# Patient Record
Sex: Male | Born: 1945 | Race: White | Hispanic: No | Marital: Single | Smoking: Current every day smoker
Health system: Southern US, Community
[De-identification: ages and names within clinical notes are randomized; demographics above are authoritative.]

## PROBLEM LIST (undated history)

## (undated) DIAGNOSIS — I219 Acute myocardial infarction, unspecified: Secondary | ICD-10-CM

## (undated) DIAGNOSIS — C61 Malignant neoplasm of prostate: Secondary | ICD-10-CM

## (undated) DIAGNOSIS — I10 Essential (primary) hypertension: Secondary | ICD-10-CM

## (undated) HISTORY — PX: BACK SURGERY: SHX140

## (undated) HISTORY — PX: COLON RESECTION: SHX5231

## (undated) HISTORY — PX: APPENDECTOMY: SHX54

---

## 2004-09-20 ENCOUNTER — Emergency Department: Payer: Self-pay | Admitting: Emergency Medicine

## 2010-10-30 ENCOUNTER — Emergency Department: Payer: Self-pay | Admitting: Emergency Medicine

## 2010-12-13 ENCOUNTER — Emergency Department: Payer: Self-pay | Admitting: Unknown Physician Specialty

## 2016-12-19 ENCOUNTER — Emergency Department: Payer: Medicare Other

## 2016-12-19 ENCOUNTER — Inpatient Hospital Stay
Admission: EM | Admit: 2016-12-19 | Discharge: 2016-12-20 | DRG: 312 | Disposition: A | Discharge status: Home / Self Care | Payer: Medicare Other | Attending: Internal Medicine | Admitting: Internal Medicine

## 2016-12-19 DIAGNOSIS — Z8051 Family history of malignant neoplasm of kidney: Secondary | ICD-10-CM

## 2016-12-19 DIAGNOSIS — S0003XA Contusion of scalp, initial encounter: Secondary | ICD-10-CM

## 2016-12-19 DIAGNOSIS — C7951 Secondary malignant neoplasm of bone: Secondary | ICD-10-CM | POA: Diagnosis present

## 2016-12-19 DIAGNOSIS — R55 Syncope and collapse: Secondary | ICD-10-CM | POA: Diagnosis not present

## 2016-12-19 DIAGNOSIS — I10 Essential (primary) hypertension: Secondary | ICD-10-CM | POA: Diagnosis present

## 2016-12-19 DIAGNOSIS — Z8249 Family history of ischemic heart disease and other diseases of the circulatory system: Secondary | ICD-10-CM

## 2016-12-19 DIAGNOSIS — W1839XA Other fall on same level, initial encounter: Secondary | ICD-10-CM | POA: Diagnosis present

## 2016-12-19 DIAGNOSIS — N289 Disorder of kidney and ureter, unspecified: Secondary | ICD-10-CM

## 2016-12-19 DIAGNOSIS — I951 Orthostatic hypotension: Principal | ICD-10-CM | POA: Diagnosis present

## 2016-12-19 DIAGNOSIS — J101 Influenza due to other identified influenza virus with other respiratory manifestations: Secondary | ICD-10-CM

## 2016-12-19 DIAGNOSIS — Z79899 Other long term (current) drug therapy: Secondary | ICD-10-CM | POA: Diagnosis not present

## 2016-12-19 DIAGNOSIS — Z7982 Long term (current) use of aspirin: Secondary | ICD-10-CM

## 2016-12-19 DIAGNOSIS — C61 Malignant neoplasm of prostate: Secondary | ICD-10-CM | POA: Diagnosis present

## 2016-12-19 DIAGNOSIS — Y92511 Restaurant or cafe as the place of occurrence of the external cause: Secondary | ICD-10-CM | POA: Diagnosis not present

## 2016-12-19 DIAGNOSIS — N179 Acute kidney failure, unspecified: Secondary | ICD-10-CM | POA: Diagnosis present

## 2016-12-19 HISTORY — DX: Acute myocardial infarction, unspecified: I21.9

## 2016-12-19 HISTORY — DX: Essential (primary) hypertension: I10

## 2016-12-19 HISTORY — DX: Malignant neoplasm of prostate: C61

## 2016-12-19 LAB — CBC
HEMATOCRIT: 42.3 % (ref 40.0–52.0)
HEMOGLOBIN: 14.2 g/dL (ref 13.0–18.0)
MCH: 29.7 pg (ref 26.0–34.0)
MCHC: 33.5 g/dL (ref 32.0–36.0)
MCV: 88.6 fL (ref 80.0–100.0)
Platelets: 183 10*3/uL (ref 150–440)
RBC: 4.77 MIL/uL (ref 4.40–5.90)
RDW: 13.9 % (ref 11.5–14.5)
WBC: 6.8 10*3/uL (ref 3.8–10.6)

## 2016-12-19 LAB — BASIC METABOLIC PANEL
ANION GAP: 14 (ref 5–15)
BUN: 41 mg/dL — ABNORMAL HIGH (ref 6–20)
CO2: 26 mmol/L (ref 22–32)
Calcium: 9.4 mg/dL (ref 8.9–10.3)
Chloride: 96 mmol/L — ABNORMAL LOW (ref 101–111)
Creatinine, Ser: 2.05 mg/dL — ABNORMAL HIGH (ref 0.61–1.24)
GFR calc Af Amer: 36 mL/min — ABNORMAL LOW (ref >60)
GFR calc non Af Amer: 31 mL/min — ABNORMAL LOW (ref >60)
GLUCOSE: 130 mg/dL — AB (ref 65–99)
POTASSIUM: 3.7 mmol/L (ref 3.5–5.1)
Sodium: 136 mmol/L (ref 135–145)

## 2016-12-19 LAB — INFLUENZA PANEL BY PCR (TYPE A & B)
Influenza A By PCR: POSITIVE — AB
Influenza B By PCR: NEGATIVE

## 2016-12-19 MED ORDER — ONDANSETRON HCL 4 MG/2ML IJ SOLN
4.0000 mg | Freq: Once | INTRAMUSCULAR | Status: DC
  Filled 2016-12-19: qty 2

## 2016-12-19 MED ORDER — SODIUM CHLORIDE 0.9 % IV BOLUS (SEPSIS)
1000.0000 mL | Freq: Once | INTRAVENOUS | Status: AC
  Administered 2016-12-19: 1000 mL via INTRAVENOUS

## 2016-12-19 MED ORDER — OSELTAMIVIR PHOSPHATE 75 MG PO CAPS
75.0000 mg | ORAL_CAPSULE | Freq: Once | ORAL | Status: AC
  Administered 2016-12-19: 75 mg via ORAL
  Filled 2016-12-19: qty 1

## 2016-12-19 MED ORDER — SODIUM CHLORIDE 0.9 % IV BOLUS (SEPSIS)
1000.0000 mL | Freq: Once | INTRAVENOUS | Status: AC
Start: 2016-12-19 — End: 2016-12-19
  Administered 2016-12-19: 1000 mL via INTRAVENOUS

## 2016-12-19 MED ORDER — IBUPROFEN 600 MG PO TABS
600.0000 mg | ORAL_TABLET | Freq: Once | ORAL | Status: AC
  Administered 2016-12-19: 600 mg via ORAL
  Filled 2016-12-19: qty 1

## 2016-12-19 NOTE — ED Provider Notes (Signed)
[facility] Emergency Department Provider Note  ____________________________________________  Time seen: Approximately 6:16 PM  I have reviewed the triage vital signs and the nursing notes.   HISTORY  Chief Complaint Loss of Consciousness; Head Injury; and Fall    HPI Jeremy Suarez is a 71 y.o. 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 The patient reports that he has been feeling poorly with generalized weakness and a nonproductive mild cough for the past 4 days. His roommate currently has fever and flulike symptoms. He has been drinking plenty of fluid but has not been eating. He had one episode of vomiting after eating chicken noodle soup. Today, he went to a restaurant and was ordering food at the counter when he had a brief syncopal episode and hit his head. He did not have any preceding symptoms, including chest pain, shortness of breath, visual changes, palpitations or lightheadedness. No urinary incontinence. At this time, the patient reports some mild discomfort around a left-sided scalp hematoma but otherwise denies any symptoms.   No past medical history on file.  There are no active problems to display for this patient.   No past surgical history on file.    Allergies Patient has no known allergies.  No family history on file.  Social History Social History  Substance Use Topics  . Smoking status: Current Every Day Smoker  . Smokeless tobacco: Never Used  . Alcohol use Not on file    Review of Systems Constitutional: No fever/chills.No lightheadedness. Positive syncope. Eyes: No visual changes. No blurred or double vision. ENT: No sore throat. No congestion or rhinorrhea. No ear pain. Positive scalp contusion. Cardiovascular: Denies chest pain. Denies palpitations. Respiratory: Denies shortness of breath.  Positive mild cough. Gastrointestinal: No abdominal pain.  Positive  nausea, with one episode of vomiting.  No diarrhea.  No constipation. Genitourinary: Negative for dysuria. Musculoskeletal: Negative for back pain. No neck pain. Skin: Negative for rash. Neurological: Negative for headaches. No focal numbness, tingling or weakness. No difficulty walking.  10-point ROS otherwise negative.  ____________________________________________   PHYSICAL EXAM:  VITAL SIGNS: ED Triage Vitals  Enc Vitals Group     BP      Pulse      Resp      Temp      Temp src      SpO2      Weight      Height      Head Circumference      Peak Flow      Pain Score      Pain Loc      Pain Edu?      Excl. in [place]?     Constitutional: The patient is alert and oriented and answering questions appropriately. GCS is 15.  Eyes: Conjunctivae are normal.  EOMI. PERRLA. No scleral icterus. No eye discharge. No raccoon eyes. Head: 3 x 3" mild contusion over the left scalp without any skin break or ecchymosis.. No Battle sign. Nose: No congestion/rhinnorhea. No swelling over the nose or septal hematoma. EARS: TMs are without hemotympanum bilaterally. Mouth/Throat: Mucous membranes are dry. No dental injury or malocclusion. Neck: No stridor.  Supple.  No JVD. No meningismus. No midline C-spine tenderness to palpation, step-offs or deformities. Cardiovascular: Normal rate, regular rhythm. No murmurs, rubs or gallops.  Respiratory: Normal respiratory effort.  No accessory muscle use or retractions. Lungs CTAB.  No wheezes, rales or ronchi. Gastrointestinal: Soft, nontender and nondistended.  No guarding or rebound.  No peritoneal signs. Musculoskeletal: No LE edema. No ttp in the calves or palpable cords.  Negative Homan's sign. Pelvis is stable. No T or L-spine tenderness to palpation, step-offs or deformities. Neurologic:  A&Ox3.  Speech is clear.  Face and smile are symmetric.  EOMI.  Moves all extremities well. Skin:  Skin is warm, dry and intact. No rash noted. Psychiatric: Mood  and affect are normal. Speech and behavior are normal.  Normal judgement.  ____________________________________________   LABS (all labs ordered are listed, but only abnormal results are displayed)  Labs Reviewed  BASIC METABOLIC PANEL - Abnormal; Notable for the following:       Result Value   Chloride 96 (*)    Glucose, Bld 130 (*)    BUN 41 (*)    Creatinine, Ser 2.05 (*)    GFR calc non Af Amer 31 (*)    GFR calc Af Amer 36 (*)    All other components within normal limits  CBC  URINALYSIS, COMPLETE (UACMP) WITH MICROSCOPIC  TROPONIN I  INFLUENZA PANEL BY PCR (TYPE A & B)   ____________________________________________  EKG  ED ECG REPORT I, Eula Listen, the attending physician, personally viewed and interpreted this ECG.   Date: 12/19/2016  EKG Time: 1748  Rate: 88  Rhythm: normal sinus rhythm; poor baseline tracing  Axis: normal  Intervals:none  ST&T Change: No STEMI  ____________________________________________  RADIOLOGY  Dg Chest 2 View  Result Date: 12/19/2016 CLINICAL DATA:  Apnea, patient passed out.  Confusion. EXAM: CHEST  2 VIEW COMPARISON:  CXR report 09/20/2004 FINDINGS: The heart size and mediastinal contours are within normal limits. Emphysematous hyperinflation of the lungs without pulmonary consolidation, effusion or pneumothorax. No pulmonary edema. Aortic atherosclerosis without aneurysm. No suspicious nor acute osseous abnormality. IMPRESSION: Emphysematous hyperinflation of the lungs without acute pulmonary disease. Aortic atherosclerosis. Electronically Signed   By: Ashley Royalty M.D.   On: 12/19/2016 19:00   Ct Head Wo Contrast  Result Date: 12/19/2016 CLINICAL DATA:  Syncopal episode.  Fell and hit head. EXAM: CT HEAD WITHOUT CONTRAST TECHNIQUE: Contiguous axial images were obtained from the base of the skull through the vertex without intravenous contrast. COMPARISON:  None. FINDINGS: Brain: The ventricles are in the midline without  mass effect or shift. They are normal in size in configuration for age. No extra-axial fluid collections are identified. The gray-white differentiation is maintained. No findings for hemispheric infarction or intracranial hemorrhage. The brainstem and cerebellum appear normal. 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 Vascular: Scattered vascular calcifications. No hyperdense vessels or definite aneurysm. Skull: No skull fracture or bone lesion. Sinuses/Orbits: The paranasal sinuses and mastoid air cells are grossly clear. Mild scattered mucoperiosteal thickening involving the ethmoid sinuses and left maxillary sinus. The globes are intact. Other: Left-sided scalp hematoma but no radiopaque foreign body or underlying skull fracture. IMPRESSION: No acute intracranial findings or skull fracture. Electronically Signed   By: Marijo Sanes M.D.   On: 12/19/2016 18:14    ____________________________________________   PROCEDURES  Procedure(s) performed: None  Procedures  Critical Care performed: No ____________________________________________   INITIAL IMPRESSION / ASSESSMENT AND PLAN / ED COURSE  Pertinent labs & imaging results that were available during my care of the patient were reviewed by me and considered in my medical decision making (see chart for details).  71 y.o. male currently under treatment for metastatic prostate cancer presenting with syncope after 4 days of feeling poorly, poor by mouth  intake, and mild cough. Overall, the patient has reassuring vital signs and is afebrile. Will check him for influenza, pneumonia, and per my cardiac monitor to evaluate for arrhythmia although there is no evidence of this on his EKG. A troponin has been ordered. He'll receive his head CT to rule out intracranial injury from his fall with overlying scalp contusion. We will also get a chest x-ray, urinalysis, and orthostatic vital signs. Plan reevaluation for final  disposition.  ----------------------------------------- 7:19 PM on 12/19/2016 -----------------------------------------  The patient is markedly orthostatic on examination with a blood pressure which goes into the 80s just was sitting. Intravenous fluids have been ordered. Plan admission.  ____________________________________________  FINAL CLINICAL IMPRESSION(S) / ED DIAGNOSES  Final diagnoses:  Syncope, unspecified syncope type  Renal insufficiency  Contusion of scalp, initial encounter  Orthostasis         NEW MEDICATIONS STARTED DURING THIS VISIT:  New Prescriptions   No medications on file      Eula Listen, MD 12/19/16 1919

## 2016-12-19 NOTE — ED Triage Notes (Signed)
He arrives today via [facility] from a [place] take out where he drove himself and walked in then he had "passed out"   EMS reports that he was apneic with no pulse upon arrival of the fire dept but when the paramedic arrived he had ROSC  alert but confused upon arrival  He is rubbing the top of his head  EMS reports that bystanders stated he hit his head

## 2016-12-19 NOTE — ED Notes (Signed)
Gave pt food tray and ginger ale

## 2016-12-19 NOTE — H&P (Signed)
History and Physical   [facility] - [place] @ [facility] Admission History and Physical [employer], D.O.    Patient Name: Jeremy Suarez MR#: [id number] Date of Birth: 29-Nov-1945 Date of Admission: 12/19/2016  Referring MD/NP/PA: Dr. Mariea Clonts Primary Care Physician: No PCP Per Patient Outpatient Specialists: [place]  Patient coming from: Home  Chief Complaint: Syncope  HPI: Jeremy Suarez is a 71 y.o. male with a known history of prostate cancer with mets to bone (on hormonal therapy, declines chemo/radiation) was in a usual state of health until about 4 days ago when he describes the sudden onset of flulike symptoms including body aches, fevers/chills, weakness, dizziness, nonproductive cough. Patient states that he was at his brother's funeral when he interacted with several sits sick contacts. Today he was at a restaurant when he had an episode of sudden onset of dizziness and subsequent loss of consciousness with head trauma. He denied any symptoms such as chest pain, shortness of breath, nausea, palpitations. Symptoms resolved spontaneously   Otherwise there has been no change in status. Patient has been taking medication as prescribed and there has been no recent change in medication or diet.  No recent antibiotics.  There has been no recent, hospitalizations, travel. Positive sick contact at brother's funeral.  ED Course: Patient rec'd zofran, Tamiflu, NS x 3L  Review of Systems:  CONSTITUTIONAL: Positive fever/chills, fatigue, weakness, negative weight gain/loss, headache. EYES: No blurry or double vision. ENT: No tinnitus, postnasal drip, redness or soreness of the oropharynx. RESPIRATORY: Positive cough, negative dyspnea, wheeze.  No hemoptysis.  CARDIOVASCULAR: No chest pain, palpitations, syncope, orthopnea. No lower extremity edema.  GASTROINTESTINAL: No nausea, vomiting, abdominal pain, diarrhea, constipation.  No hematemesis, melena or hematochezia. GENITOURINARY: No  dysuria, frequency, hematuria. ENDOCRINE: No polyuria or nocturia. No heat or cold intolerance. HEMATOLOGY: No anemia, bruising, bleeding. INTEGUMENTARY: No rashes, ulcers, lesions. MUSCULOSKELETAL: No arthritis, gout, dyspnea. NEUROLOGIC: No numbness, tingling, ataxia, seizure-type activity, positive loss of consciousness and weakness. PSYCHIATRIC: No anxiety, depression, insomnia.   Medical history significant for hypertension, MI, prostate cancer  Surgical history significant for colon resection for polyps, back surgery and appendectomy   reports that he has been smoking.  He has never used smokeless tobacco. His alcohol and drug histories are not on file.  No Known Allergies  Family history significant for mom died of consultations related to DVT, father died of kidney cancer and brother died of heart attack. Family history has been reviewed and confirmed with patient.   Prior to Admission medications   Medication Sig Start Date End Date Taking? Authorizing Provider  aspirin EC 81 MG tablet Take 81 mg by mouth daily.   Yes Historical Provider, MD  METOPROLOL TARTRATE PO Take by mouth.   Yes Historical Provider, MD    Physical Exam: Vitals:   12/19/16 1827  BP: 102/74  Pulse: 82  Resp: 18  Temp: 98 F (36.7 C)  TempSrc: Oral  SpO2: 99%  Weight: 74.8 kg (165 lb)  Height: 5\' 10"  (1.778 m)    GENERAL: 71 y.o.-year-old male patient, well-developed, well-nourished lying in the bed in no acute distress.  Pleasant and cooperative.   HEENT: Head normocephalic. Left scalp intrusion. Pupils equal, round, reactive to light and accommodation. No scleral icterus. Extraocular muscles intact. Nares are patent. Oropharynx is clear. Mucus membranes dry NECK: Supple, full range of motion. No JVD, no bruit heard. No thyroid enlargement, no tenderness, no cervical lymphadenopathy. CHEST: Normal breath sounds bilaterally. No wheezing, rales, rhonchi or crackles.  No use of accessory muscles  of respiration.  No reproducible chest wall tenderness.  CARDIOVASCULAR: S1, S2 normal. No murmurs, rubs, or gallops. Cap refill <2 seconds. Pulses intact distally.  ABDOMEN: Soft, nondistended, nontender. No rebound, guarding, rigidity. Normoactive bowel sounds present in all four quadrants. No organomegaly or mass. EXTREMITIES: No pedal edema, cyanosis, or clubbing. No calf tenderness or Homan's sign.  NEUROLOGIC: The patient is alert and oriented x 3. Cranial nerves II through XII are grossly intact with no focal sensorimotor deficit. Muscle strength 5/5 in all extremities. Sensation intact. Gait not checked. PSYCHIATRIC:  Normal affect, mood, thought content. SKIN: Warm, dry, and intact without obvious rash, lesion, or ulcer.    Labs on Admission:  CBC:  Recent Labs Lab 12/19/16 1751  WBC 6.8  HGB 14.2  HCT 42.3  MCV 88.6  PLT [id number]   Basic Metabolic Panel:  Recent Labs Lab 12/19/16 1751  NA 136  K 3.7  CL 96*  CO2 26  GLUCOSE 130*  BUN 41*  CREATININE 2.05*  CALCIUM 9.4   GFR: Estimated Creatinine Clearance: 35.1 mL/min (by C-G formula based on SCr of 2.05 mg/dL (H)). Liver Function Tests: No results for input(s): AST, ALT, ALKPHOS, BILITOT, PROT, ALBUMIN in the last 168 hours. No results for input(s): LIPASE, AMYLASE in the last 168 hours. No results for input(s): AMMONIA in the last 168 hours. Coagulation Profile: No results for input(s): INR, PROTIME in the last 168 hours. Cardiac Enzymes: No results for input(s): CKTOTAL, CKMB, CKMBINDEX, TROPONINI in the last 168 hours. BNP (last 3 results) No results for input(s): PROBNP in the last 8760 hours. HbA1C: No results for input(s): HGBA1C in the last 72 hours. CBG: No results for input(s): GLUCAP in the last 168 hours. Lipid Profile: No results for input(s): CHOL, HDL, LDLCALC, TRIG, CHOLHDL, LDLDIRECT in the last 72 hours. Thyroid Function Tests: No results for input(s): TSH, T4TOTAL, FREET4, T3FREE,  THYROIDAB in the last 72 hours. Anemia Panel: No results for input(s): VITAMINB12, FOLATE, FERRITIN, TIBC, IRON, RETICCTPCT in the last 72 hours. Urine analysis: No results found for: COLORURINE, APPEARANCEUR, LABSPEC, PHURINE, GLUCOSEU, HGBUR, BILIRUBINUR, KETONESUR, PROTEINUR, UROBILINOGEN, NITRITE, LEUKOCYTESUR Sepsis Labs: @LABRCNTIP (procalcitonin:4,lacticidven:4) )No results found for this or any previous visit (from the past 240 hour(s)).   Radiological Exams on Admission: Dg Chest 2 View  Result Date: 12/19/2016 CLINICAL DATA:  Apnea, patient passed out.  Confusion. EXAM: CHEST  2 VIEW COMPARISON:  CXR report 09/20/2004 FINDINGS: The heart size and mediastinal contours are within normal limits. Emphysematous hyperinflation of the lungs without pulmonary consolidation, effusion or pneumothorax. No pulmonary edema. Aortic atherosclerosis without aneurysm. No suspicious nor acute osseous abnormality. IMPRESSION: Emphysematous hyperinflation of the lungs without acute pulmonary disease. Aortic atherosclerosis. Electronically Signed   By: Ashley Royalty M.D.   On: 12/19/2016 19:00   Ct Head Wo Contrast  Result Date: 12/19/2016 CLINICAL DATA:  Syncopal episode.  Fell and hit head. EXAM: CT HEAD WITHOUT CONTRAST TECHNIQUE: Contiguous axial images were obtained from the base of the skull through the vertex without intravenous contrast. COMPARISON:  None. FINDINGS: Brain: The ventricles are in the midline without mass effect or shift. They are normal in size in configuration for age. No extra-axial fluid collections are identified. The gray-white differentiation is maintained. No findings for hemispheric infarction or intracranial hemorrhage. The brainstem and cerebellum appear normal. 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 Vascular: Scattered vascular calcifications. No hyperdense vessels or definite aneurysm. Skull: No skull fracture or bone  lesion. Sinuses/Orbits: The paranasal sinuses and  mastoid air cells are grossly clear. Mild scattered mucoperiosteal thickening involving the ethmoid sinuses and left maxillary sinus. The globes are intact. Other: Left-sided scalp hematoma but no radiopaque foreign body or underlying skull fracture. IMPRESSION: No acute intracranial findings or skull fracture. Electronically Signed   By: Marijo Sanes M.D.   On: 12/19/2016 18:14    EKG: Normal sinus rhythm at 88 bpm with normal axis and nonspecific ST-T wave changes.   Assessment/Plan Active Problems:   * No active hospital problems. *    This is a 71 y.o. male with a history of hypertension, MI prostate cancer with bone mets now being admitted with:  1. Syncope likely secondary to orthostatic hypotension - Admit observation with telemetry monitoring - IV fluid hydration - Trend trops, check TSH, lipids - Consider cardio consult - Obtain records from [place] and consider echo, carotids (patient thinks he may have had these done recently)  2. Acute kidney injury  - IV fluids and repeat BMP in AM.  - Avoid nephrotoxic medications - hold lisinopril for now - Bladder scan and place foley catheter if evidence of urinary retention  3. Influenza B - Start Tamiflu  Admission status: Inpatient, telemetry  IV Fluids: IVNS Diet/Nutrition: Heart healthy Consults called: PT  DVT Px: Lovenox, SCDs and early ambulation. Code Status: Full Code  Disposition Plan: To home in 1-2 days   All the records are reviewed and case discussed with ED provider. Management plans discussed with the patient and/or family who express understanding and agree with plan of care.  Thelia Tanksley D.O. on 12/19/2016 at 10:43 PM Between 7am to 6pm - Pager - [phone] After 6pm go to [email or web address] - [employer] [place] [facility] Office [phone] CC: Primary care physician; No PCP Per Patient   12/19/2016, 10:43 PM

## 2016-12-20 ENCOUNTER — Inpatient Hospital Stay: Payer: Medicare Other

## 2016-12-20 ENCOUNTER — Inpatient Hospital Stay (HOSPITAL_COMMUNITY)
Admit: 2016-12-20 | Discharge: 2016-12-20 | Disposition: A | Discharge status: Home / Self Care | Payer: Medicare Other | Attending: Family Medicine | Admitting: Family Medicine

## 2016-12-20 ENCOUNTER — Encounter: Payer: Self-pay | Admitting: Family Medicine

## 2016-12-20 DIAGNOSIS — R55 Syncope and collapse: Secondary | ICD-10-CM

## 2016-12-20 LAB — URINE DRUG SCREEN, QUALITATIVE (ARMC ONLY)
AMPHETAMINES, UR SCREEN: NOT DETECTED
Barbiturates, Ur Screen: NOT DETECTED
Benzodiazepine, Ur Scrn: NOT DETECTED
Cannabinoid 50 Ng, Ur ~~LOC~~: POSITIVE — AB
Cocaine Metabolite,Ur ~~LOC~~: NOT DETECTED
MDMA (ECSTASY) UR SCREEN: NOT DETECTED
METHADONE SCREEN, URINE: NOT DETECTED
OPIATE, UR SCREEN: NOT DETECTED
Phencyclidine (PCP) Ur S: NOT DETECTED
Tricyclic, Ur Screen: NOT DETECTED

## 2016-12-20 LAB — URINALYSIS, DIPSTICK ONLY
Bilirubin Urine: NEGATIVE
GLUCOSE, UA: NEGATIVE mg/dL
Ketones, ur: 5 mg/dL — AB
LEUKOCYTES UA: NEGATIVE
Nitrite: NEGATIVE
PH: 5 (ref 5.0–8.0)
Protein, ur: NEGATIVE mg/dL
SPECIFIC GRAVITY, URINE: 1.012 (ref 1.005–1.030)

## 2016-12-20 LAB — URINALYSIS, COMPLETE (UACMP) WITH MICROSCOPIC
BILIRUBIN URINE: NEGATIVE
GLUCOSE, UA: NEGATIVE mg/dL
KETONES UR: 5 mg/dL — AB
LEUKOCYTES UA: NEGATIVE
Nitrite: NEGATIVE
PH: 5 (ref 5.0–8.0)
Protein, ur: NEGATIVE mg/dL
SPECIFIC GRAVITY, URINE: 1.012 (ref 1.005–1.030)

## 2016-12-20 LAB — TROPONIN I
Troponin I: <0.03 ng/mL (ref <0.03)
Troponin I: <0.03 ng/mL (ref <0.03)
Troponin I: <0.03 ng/mL (ref <0.03)

## 2016-12-20 LAB — ECHOCARDIOGRAM COMPLETE
HEIGHTINCHES: 70 in
Weight: 2640 oz

## 2016-12-20 LAB — LIPID PANEL
CHOL/HDL RATIO: 9 ratio
CHOLESTEROL: 217 mg/dL — AB (ref 0–200)
HDL: 24 mg/dL — ABNORMAL LOW (ref >40)
LDL Cholesterol: 154 mg/dL — ABNORMAL HIGH (ref 0–99)
Triglycerides: 197 mg/dL — ABNORMAL HIGH (ref <150)
VLDL: 39 mg/dL (ref 0–40)

## 2016-12-20 LAB — TSH: TSH: 1.293 u[IU]/mL (ref 0.350–4.500)

## 2016-12-20 MED ORDER — ENOXAPARIN SODIUM 30 MG/0.3ML ~~LOC~~ SOLN
30.0000 mg | SUBCUTANEOUS | Status: DC
  Administered 2016-12-20: 30 mg via SUBCUTANEOUS
  Filled 2016-12-20: qty 0.3

## 2016-12-20 MED ORDER — DM-GUAIFENESIN ER 30-600 MG PO TB12: 1.0000 | ORAL_TABLET | Freq: Two times a day (BID) | ORAL | Status: DC

## 2016-12-20 MED ORDER — ACETAMINOPHEN 650 MG RE SUPP: 650.0000 mg | RECTAL | Status: DC | PRN

## 2016-12-20 MED ORDER — ASPIRIN EC 81 MG PO TBEC
81.0000 mg | DELAYED_RELEASE_TABLET | Freq: Every day | ORAL | Status: DC
  Administered 2016-12-20: 81 mg via ORAL
  Filled 2016-12-20: qty 1

## 2016-12-20 MED ORDER — OSELTAMIVIR PHOSPHATE 30 MG PO CAPS: 30.0000 mg | ORAL_CAPSULE | Freq: Two times a day (BID) | ORAL | 0 refills | Status: AC

## 2016-12-20 MED ORDER — DEXTROMETHORPHAN POLISTIREX ER 30 MG/5ML PO SUER
30.0000 mg | Freq: Two times a day (BID) | ORAL | Status: DC
  Administered 2016-12-20 (×2): 30 mg via ORAL
  Filled 2016-12-20 (×3): qty 5

## 2016-12-20 MED ORDER — ACETAMINOPHEN 160 MG/5ML PO SOLN
650.0000 mg | ORAL | Status: DC | PRN
  Filled 2016-12-20: qty 20.3

## 2016-12-20 MED ORDER — ENOXAPARIN SODIUM 40 MG/0.4ML ~~LOC~~ SOLN: 40.0000 mg | SUBCUTANEOUS | Status: DC

## 2016-12-20 MED ORDER — SENNOSIDES-DOCUSATE SODIUM 8.6-50 MG PO TABS
1.0000 | ORAL_TABLET | Freq: Every evening | ORAL | Status: DC | PRN
  Administered 2016-12-20: 1 via ORAL
  Filled 2016-12-20: qty 1

## 2016-12-20 MED ORDER — NICOTINE 21 MG/24HR TD PT24
21.0000 mg | MEDICATED_PATCH | Freq: Every day | TRANSDERMAL | Status: DC
  Administered 2016-12-20: 21 mg via TRANSDERMAL
  Filled 2016-12-20: qty 1

## 2016-12-20 MED ORDER — GUAIFENESIN ER 600 MG PO TB12
600.0000 mg | ORAL_TABLET | Freq: Two times a day (BID) | ORAL | Status: DC
  Administered 2016-12-20 (×2): 600 mg via ORAL
  Filled 2016-12-20 (×2): qty 1

## 2016-12-20 MED ORDER — ACETAMINOPHEN 325 MG PO TABS
650.0000 mg | ORAL_TABLET | ORAL | Status: DC | PRN
  Administered 2016-12-20: 650 mg via ORAL
  Filled 2016-12-20: qty 2

## 2016-12-20 MED ORDER — METOPROLOL TARTRATE 25 MG PO TABS
12.5000 mg | ORAL_TABLET | Freq: Two times a day (BID) | ORAL | Status: DC
  Administered 2016-12-20 (×2): 12.5 mg via ORAL
  Filled 2016-12-20 (×2): qty 1

## 2016-12-20 MED ORDER — OSELTAMIVIR PHOSPHATE 30 MG PO CAPS
30.0000 mg | ORAL_CAPSULE | Freq: Two times a day (BID) | ORAL | Status: DC
  Administered 2016-12-20: 30 mg via ORAL
  Filled 2016-12-20 (×3): qty 1

## 2016-12-20 NOTE — Progress Notes (Signed)
Anticoagulation monitoring(Lovenox):  71yo  M ordered Lovenox 30 mg Q24h  Filed Weights   12/19/16 1827  Weight: 165 lb (74.8 kg)   BMI 23.7   Lab Results  Component Value Date   CREATININE 2.05 (H) 12/19/2016   Estimated Creatinine Clearance: 35.1 mL/min (by C-G formula based on SCr of 2.05 mg/dL (H)). Hemoglobin & Hematocrit     Component Value Date/Time   HGB 14.2 12/19/2016 1751   HCT 42.3 12/19/2016 1751     Per Protocol for Patient with estCrcl > 30 ml/min and BMI < 40, will transition to Lovenox 40 mg Q24h      Chinita Greenland PharmD Clinical Pharmacist 12/20/2016

## 2016-12-20 NOTE — Progress Notes (Signed)
Pharmacist - Prescriber Communication  Tamiflu has been modified to 30 mg po BID due to creatinine clearance 30 to 60 mL/min.  Jaidalyn Schillo A. [place], [place].D., BCPS Clinical Pharmacist 12/20/2016 [phone]

## 2016-12-20 NOTE — Discharge Instructions (Signed)
[facility] - [place] at [facility] ° °DIET:  °Cardiac diet ° °DISCHARGE CONDITION:  °Stable ° °ACTIVITY:  °Activity as tolerated ° °OXYGEN:  °Home Oxygen: No. °  °Oxygen Delivery: room air ° °DISCHARGE LOCATION:  °home  ° ° °ADDITIONAL DISCHARGE INSTRUCTION: ° ° °If you experience worsening of your admission symptoms, develop shortness of breath, life threatening emergency, suicidal or homicidal thoughts you must seek medical attention immediately by calling 911 or calling your MD immediately  if symptoms less severe. ° °You Must read complete instructions/literature along with all the possible adverse reactions/side effects for all the Medicines you take and that have been prescribed to you. Take any new Medicines after you have completely understood and accpet all the possible adverse reactions/side effects.  ° °Please note ° °You were cared for by a hospitalist during your hospital stay. If you have any questions about your discharge medications or the care you received while you were in the hospital after you are discharged, you can call the unit and asked to speak with the hospitalist on call if the hospitalist that took care of you is not available. Once you are discharged, your primary care physician will handle any further medical issues. Please note that NO REFILLS for any discharge medications will be authorized once you are discharged, as it is imperative that you return to your primary care physician (or establish a relationship with a primary care physician if you do not have one) for your aftercare needs so that they can reassess your need for medications and monitor your lab values. ° ° °

## 2016-12-20 NOTE — Discharge Summary (Signed)
[place] at [facility], 71 y.o., DOB 11-23-1945, MRN [id number]. 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 Patient in the emergency room was noted to have flu positive as well as orthostatic hypotension. Patient was admitted for further evaluation and therapy. He is doing much better. He is continued on Tamiflu. She will be ambulated recheck her orthostatics if he is doing better he'll be discharged home.            Consults  None  Significant Tests:  See full reports for all details     Dg Chest 2 View  Result Date: 12/19/2016 CLINICAL DATA:  Apnea, patient passed out.  Confusion. EXAM: CHEST  2 VIEW COMPARISON:  CXR report 09/20/2004 FINDINGS: The heart size and mediastinal contours are within normal limits. Emphysematous hyperinflation of the lungs without pulmonary consolidation, effusion or pneumothorax. No pulmonary edema. Aortic atherosclerosis without aneurysm. No suspicious nor acute osseous abnormality. IMPRESSION: Emphysematous hyperinflation of the lungs without acute pulmonary disease. Aortic atherosclerosis. Electronically Signed   By: Ashley Royalty M.D.   On: 12/19/2016 19:00   Ct Head Wo Contrast  Result Date: 12/19/2016 CLINICAL DATA:  Syncopal episode.  Fell and hit head. EXAM: CT HEAD  WITHOUT CONTRAST TECHNIQUE: Contiguous axial images were obtained from the base of the skull through the vertex without intravenous contrast. COMPARISON:  None. FINDINGS: Brain: The ventricles are in the midline without mass effect or shift. They are normal in size in configuration for age. No extra-axial fluid collections are identified. The gray-white differentiation is maintained. No findings for hemispheric infarction or intracranial hemorrhage. The brainstem and cerebellum appear normal. 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 Vascular: Scattered vascular calcifications. No hyperdense vessels or definite aneurysm. Skull: No skull fracture or bone lesion. Sinuses/Orbits: The paranasal sinuses and mastoid air cells are grossly clear. Mild scattered mucoperiosteal thickening involving the ethmoid sinuses and left maxillary sinus. The globes are intact. Other: Left-sided scalp hematoma but no radiopaque foreign body or underlying skull fracture. IMPRESSION: No acute intracranial findings or skull fracture. Electronically Signed   By: Marijo Sanes M.D.   On: 12/19/2016 18:14   US Carotid Bilateral (at [facility])  Result Date: 12/20/2016 CLINICAL DATA:  Syncope. EXAM: BILATERAL CAROTID DUPLEX ULTRASOUND TECHNIQUE: Pearline Cables scale imaging, color Doppler and duplex ultrasound were performed of bilateral carotid and vertebral arteries in the neck. COMPARISON:  CT 12/19/2016 . FINDINGS: Criteria: Quantification of carotid stenosis is based on velocity parameters that correlate the residual internal carotid diameter with NASCET-based stenosis levels, using the diameter of the distal internal carotid lumen as the denominator for stenosis measurement. The following velocity measurements were obtained: RIGHT ICA:  76/26 cm/sec CCA:  [id number] cm/sec SYSTOLIC ICA/CCA RATIO:  1.0 DIASTOLIC ICA/CCA RATIO:  1.1 ECA:  114 cm/sec LEFT ICA:  94/34 cm/sec CCA:  [id number] cm/sec SYSTOLIC ICA/CCA RATIO:  1.3 DIASTOLIC ICA/CCA  RATIO:  1.2 ECA:  112 cm/sec RIGHT CAROTID ARTERY: Mild right carotid bifurcation and proximal ICA atherosclerotic vascular disease. No flow limiting stenosis. RIGHT VERTEBRAL ARTERY:  Patent with antegrade flow. LEFT CAROTID ARTERY: Mild left carotid bifurcation and proximal ICA atherosclerotic vascular disease . No flow limiting stenosis. LEFT VERTEBRAL ARTERY:  Patent with antegrade flow. IMPRESSION: 1. 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 No flow limiting stenosis. Degree of stenosis less than 50%. 2. Mild left carotid bifurcation and proximal ICA atherosclerotic vascular disease. No flow limiting stenosis. Degree of stenosis less than 50%. 3. Vertebral arteries are patent antegrade flow. Electronically Signed   By: Marcello Moores  Register   On: 12/20/2016 12:13       Today   Subjective:   Berneda Rose  feeling better denies any chest pain or shortness of breath  Objective:   Blood pressure (!) 177/97, pulse 73, temperature 97.9 F (36.6 C), temperature source Oral, resp. rate 17, height 5\' 10"  (1.778 m), weight 165 lb (74.8 kg), SpO2 100 %.  .  Intake/Output Summary (Last 24 hours) at 12/20/16 1225 Last data filed at 12/20/16 1000  Gross per 24 hour  Intake             3000 ml  Output             1175 ml  Net             1825 ml    Exam VITAL SIGNS: Blood pressure (!) 177/97, pulse 73, temperature 97.9 F (36.6 C), temperature source Oral, resp. rate 17, height 5\' 10"  (1.778 m), weight 165 lb (74.8 kg), SpO2 100 %.  GENERAL:  71 y.o.-year-old patient lying in the bed with no acute distress.  EYES: Pupils equal, round, reactive to light and accommodation. No scleral icterus. Extraocular muscles intact.  HEENT: Head atraumatic, normocephalic. Oropharynx and nasopharynx clear.  NECK:  Supple, no jugular venous distention. No thyroid enlargement, no tenderness.  LUNGS: Normal breath sounds bilaterally, no wheezing, rales,rhonchi or crepitation. No use of  accessory muscles of respiration.  CARDIOVASCULAR: S1, S2 normal. No murmurs, rubs, or gallops.  ABDOMEN: Soft, nontender, nondistended. Bowel sounds present. No organomegaly or mass.  EXTREMITIES: No pedal edema, cyanosis, or clubbing.  NEUROLOGIC: Cranial nerves II through XII are intact. Muscle strength 5/5 in all extremities. Sensation intact. Gait not checked.  PSYCHIATRIC: The patient is alert and oriented x 3.  SKIN: No obvious rash, lesion, or ulcer.   Data Review     CBC w Diff: Lab Results  Component Value Date   WBC 6.8 12/19/2016   HGB 14.2 12/19/2016   HCT 42.3 12/19/2016   PLT 183 12/19/2016   CMP: Lab Results  Component Value Date   NA 136 12/19/2016   K 3.7 12/19/2016   CL 96 (L) 12/19/2016   CO2 26 12/19/2016   BUN 41 (H) 12/19/2016   CREATININE 2.05 (H) 12/19/2016  .  Micro Results No results found for this or any previous visit (from the past 240 hour(s)).      Code Status Orders        Start     Ordered   12/20/16 0041  Full code  Continuous     12/20/16 0040    Code Status History    Date Active Date Inactive Code Status Order ID Comments User Context   This patient has a current code status but no historical code status.  Follow-up Information    pcp In 7 days.   Why:  PLEASE CALL YOU PRIMARY CARE DOCTOR TO [facility] FOLLOW UP APPOINTMENT AS SOON AS POSSIBLE          Discharge Medications   Allergies as of 12/20/2016   No Known Allergies     Medication List    TAKE these medications   aspirin EC 81 MG tablet Take 81 mg by mouth daily.   METOPROLOL TARTRATE PO Take by mouth.   oseltamivir 30 MG capsule Commonly known as:  TAMIFLU Take 1 capsule (30 mg total) by mouth 2 (two) times daily.          Total Time in preparing paper work, data evaluation and todays exam - 35 minutes  Dustin Flock M.D on 12/20/2016 at 12:25 PM  [facility]   Office  [phone]

## 2016-12-20 NOTE — Progress Notes (Signed)
Patient was about to be discharged when Chaplain delivered an Advance Directive. Patient said that he would take the document home with him, read it and comeback if he decides to complete it.

## 2016-12-20 NOTE — Progress Notes (Signed)
*  PRELIMINARY RESULTS* Echocardiogram 2D Echocardiogram has been performed.  Sherrie Sport 12/20/2016, 12:52 PM

## 2016-12-21 LAB — HEMOGLOBIN A1C
HEMOGLOBIN A1C: 5.7 % — AB (ref 4.8–5.6)
MEAN PLASMA GLUCOSE: 117 mg/dL

## 2018-09-27 IMAGING — CT CT HEAD W/O CM
3 series · 14 of 47 positions shown, 16 images · non-contrast
Comparison: None.

CLINICAL DATA: Syncopal episode.  Fell and hit head.

EXAM:
CT HEAD WITHOUT CONTRAST
TECHNIQUE: Contiguous axial images were obtained from the base of the skull
through the vertex without intravenous contrast.

[Series 3: head wo · axial · 0.47mm/px · z∈[-34,+101]mm · 8 of 33 slices shown, 10 images]
[im 3/33  brain]
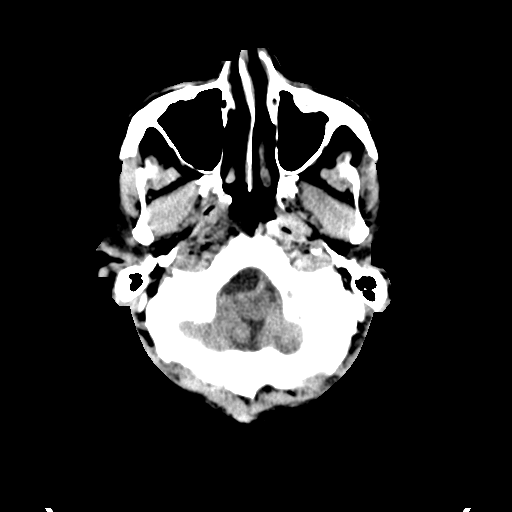
[im 3/33  bone]
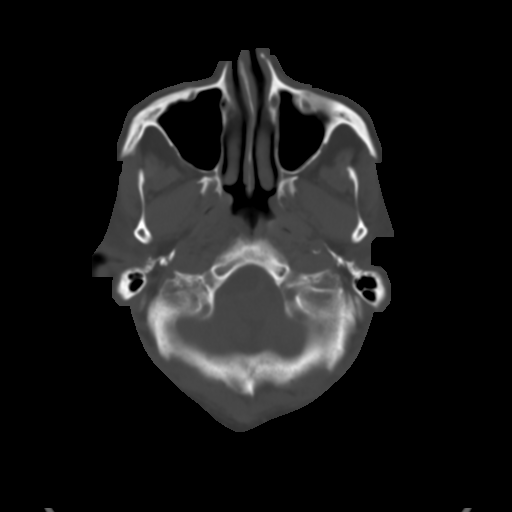
[im 7/33  brain]
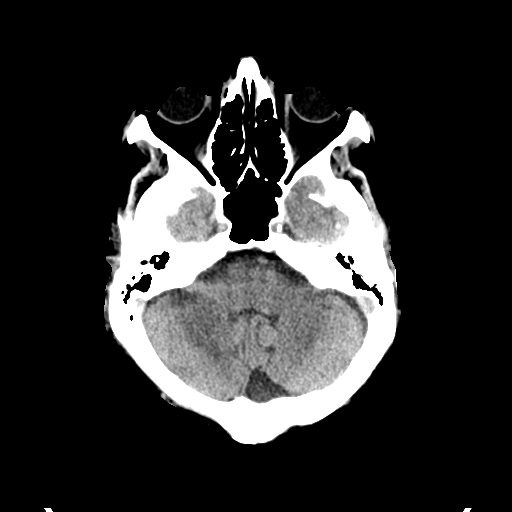
[im 10/33  brain]
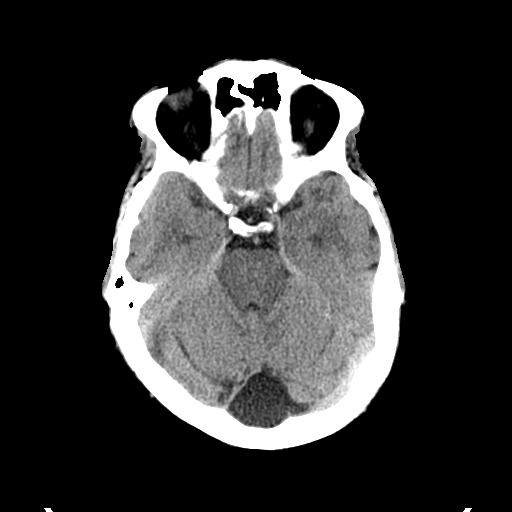
[im 15/33  brain]
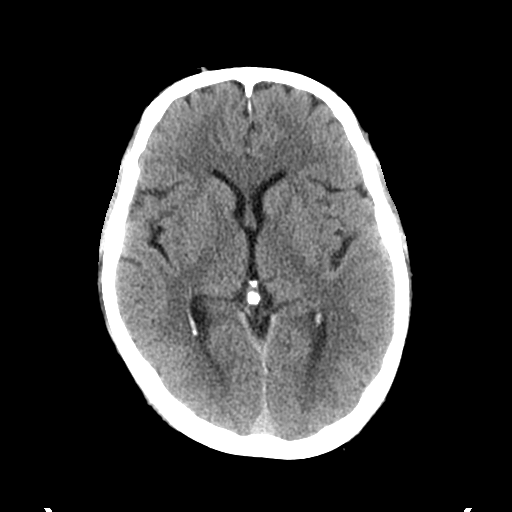
[im 18/33  brain]
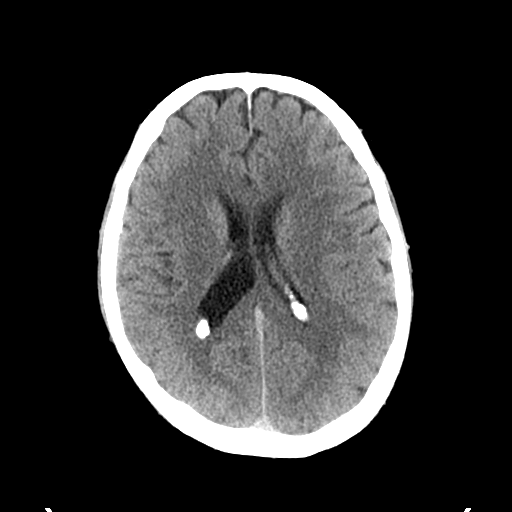
[im 18/33  bone]
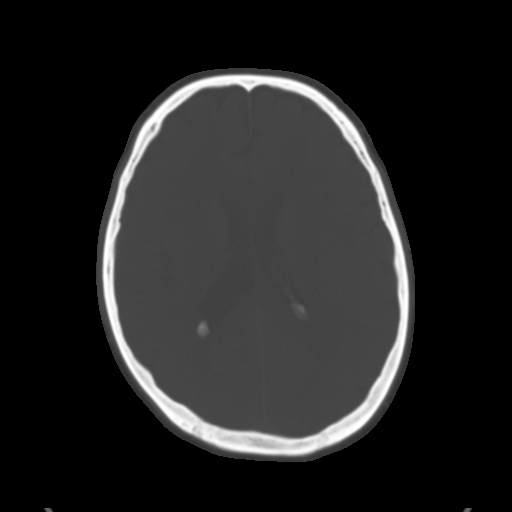
[im 23/33  brain]
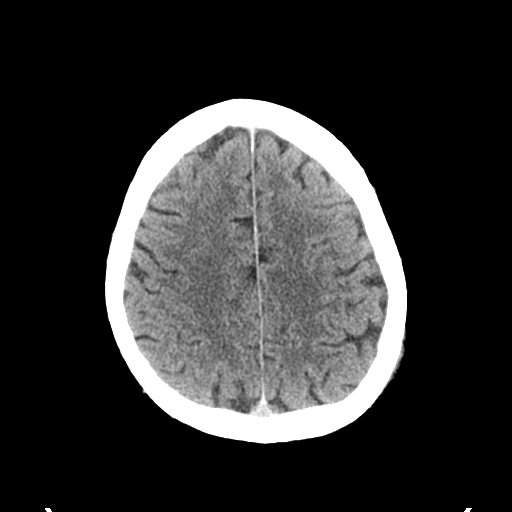
[im 26/33  brain]
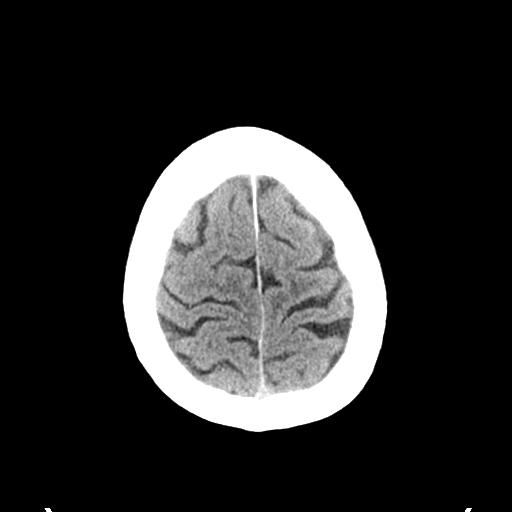
[im 30/33  brain]
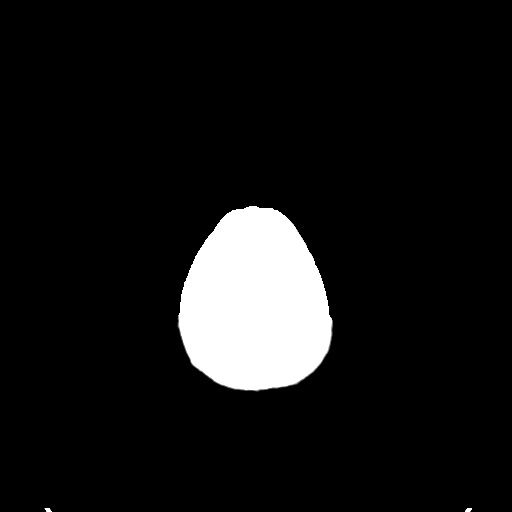

[Series 4: coronal soft tissue · coronal · 0.31mm/px · 3 of 62 slices shown]
[im 21/62  brain]
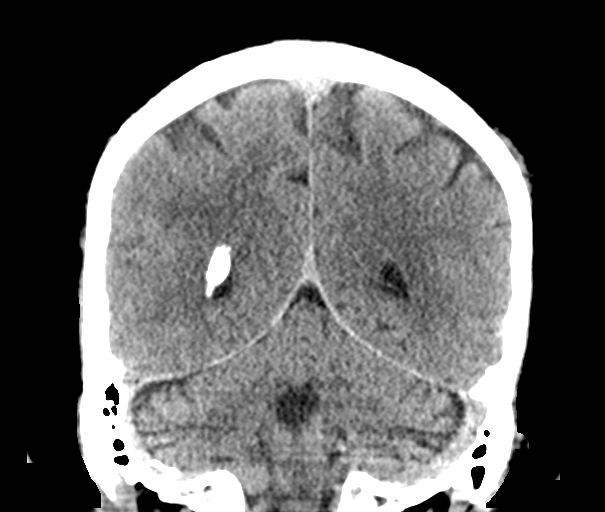
[im 28/62  brain]
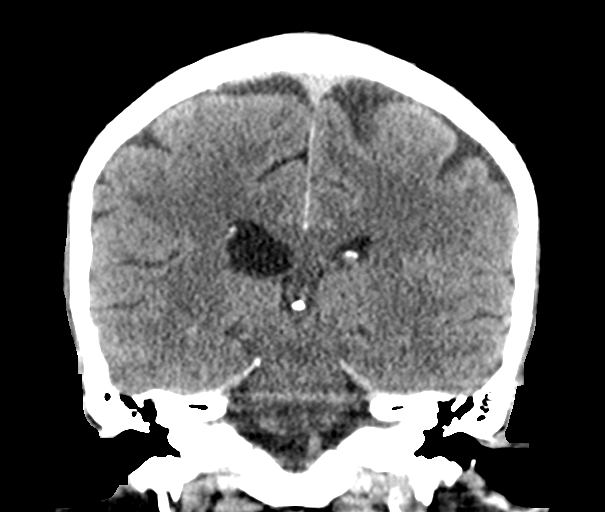
[im 34/62  brain]
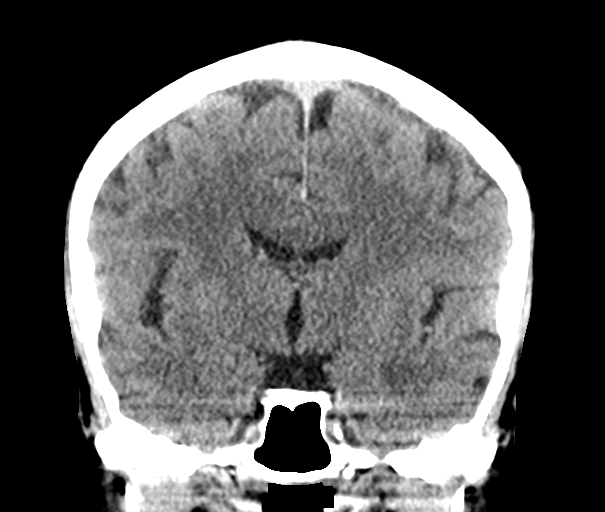

[Series 5: sagittal soft tissue · sagittal · 0.30mm/px · 3 of 52 slices shown]
[im 18/52  brain]
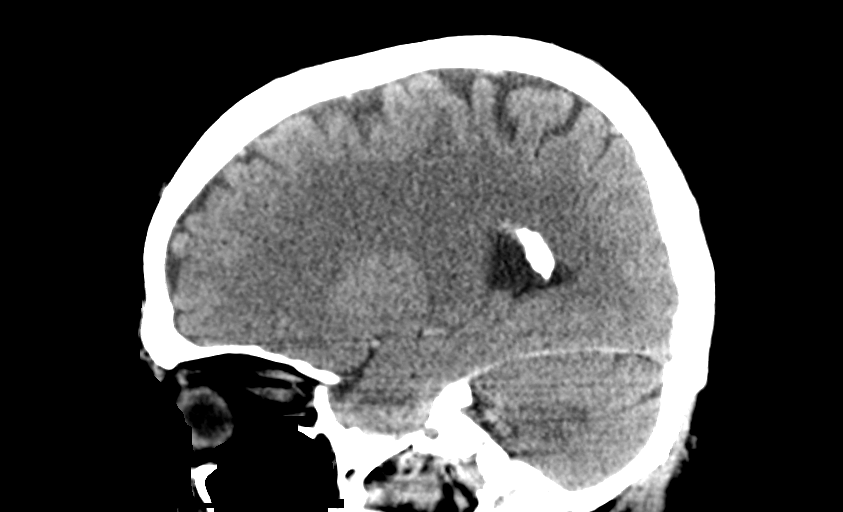
[im 26/52  brain]
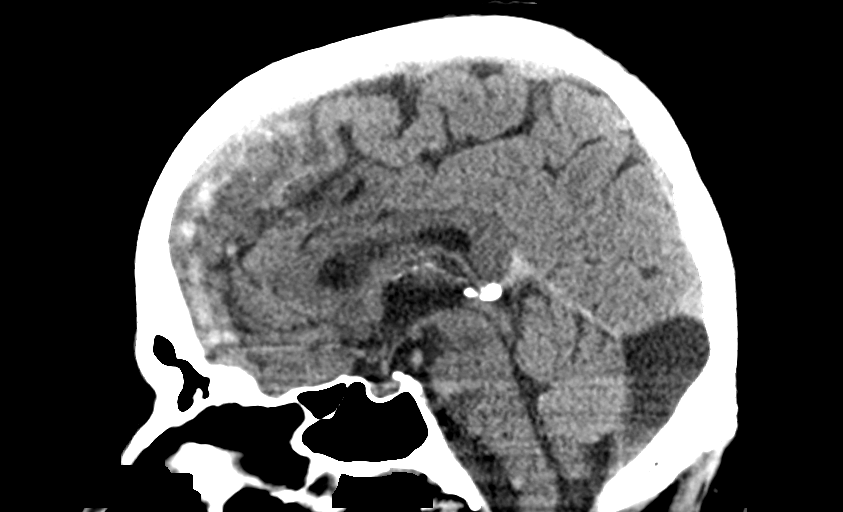
[im 35/52  brain]
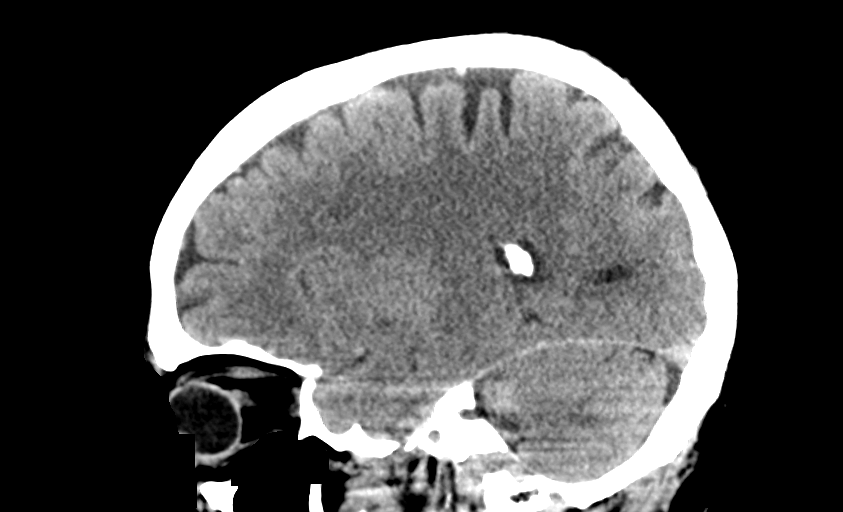

[14 of 47 positions shown; findings below may reference images not displayed]

FINDINGS: Brain: The ventricles are in the midline without mass effect or
shift. They are normal in size in configuration for age. No
extra-axial fluid collections are identified. The gray-white
differentiation is maintained. No findings for hemispheric
infarction or intracranial hemorrhage. The brainstem and cerebellum
appear normal. There is a posterior fossa giant cisterna magna.

Vascular: Scattered vascular calcifications. No hyperdense vessels
or definite aneurysm.

Skull: No skull fracture or bone lesion.

Sinuses/Orbits: The paranasal sinuses and mastoid air cells are
grossly clear. Mild scattered mucoperiosteal thickening involving
the ethmoid sinuses and left maxillary sinus.

The globes are intact.

Other: Left-sided scalp hematoma but no radiopaque foreign body or
underlying skull fracture.
IMPRESSION: No acute intracranial findings or skull fracture.
